# Patient Record
Sex: Female | Born: 1975 | Race: White | Hispanic: No | Smoking: Current every day smoker
Health system: Southern US, Community
[De-identification: ages and names within clinical notes are randomized; demographics above are authoritative.]

## PROBLEM LIST (undated history)

## (undated) DIAGNOSIS — J449 Chronic obstructive pulmonary disease, unspecified: Secondary | ICD-10-CM

## (undated) DIAGNOSIS — F32A Depression, unspecified: Secondary | ICD-10-CM

## (undated) DIAGNOSIS — F319 Bipolar disorder, unspecified: Secondary | ICD-10-CM

## (undated) HISTORY — PX: APPENDECTOMY: SHX54

## (undated) HISTORY — PX: CHOLECYSTECTOMY: SHX55

---

## 2020-11-19 ENCOUNTER — Emergency Department (HOSPITAL_COMMUNITY): Payer: Medicare (Managed Care)

## 2020-11-19 ENCOUNTER — Emergency Department (HOSPITAL_COMMUNITY)
Admission: EM | Admit: 2020-11-19 | Discharge: 2020-11-19 | Disposition: A | Discharge status: Home / Self Care | Payer: Medicare (Managed Care) | Attending: Emergency Medicine | Admitting: Emergency Medicine

## 2020-11-19 ENCOUNTER — Encounter (HOSPITAL_COMMUNITY): Payer: Self-pay | Admitting: Emergency Medicine

## 2020-11-19 DIAGNOSIS — J449 Chronic obstructive pulmonary disease, unspecified: Secondary | ICD-10-CM | POA: Diagnosis not present

## 2020-11-19 DIAGNOSIS — Z91040 Latex allergy status: Secondary | ICD-10-CM | POA: Insufficient documentation

## 2020-11-19 DIAGNOSIS — R519 Headache, unspecified: Secondary | ICD-10-CM | POA: Insufficient documentation

## 2020-11-19 HISTORY — DX: Chronic obstructive pulmonary disease, unspecified: J44.9

## 2020-11-19 MED ORDER — KETOROLAC TROMETHAMINE 30 MG/ML IJ SOLN
30.0000 mg | Freq: Once | INTRAMUSCULAR | Status: DC
  Filled 2020-11-19: qty 1

## 2020-11-19 NOTE — ED Provider Notes (Signed)
[facility] EMERGENCY DEPARTMENT Provider Note   CSN: [id number] Arrival date & time: 11/19/20  1356     History Chief Complaint  Patient presents with  . Headache    Abigail Cooper is a 45 y.o. female with history significant for COPD, methamphetamine use who presents for evaluation of headache.  Patient states she has had gradual onset of headache over the last week.  Denies any recent trauma or injuries.  No sudden onset thunderclap headache.  Tried taking Excedrin once which has not helped with her headache.  States she is recently homeless, living in her car and is under a lot of stress due to a recent break-up with a significant other.  Her headache feels like a banding across her forehead.  No lightheadedness, dizziness, vision changes, paresthesias, weakness, neck pain, neck stiffness.  Denies fever, vomiting.  She has been nauseous.  She denies any exposures to COVID.  No chest pain, shortness of breath, cough, abdominal pain, diarrhea, dysuria.  Denies additional aggravating or alleviating factors.  Patient does state that she currently does not have access to her home oxygen which she uses intermittently for her COPD.  Patient states that she is in the process of trying to obtain this from her living situation.  States she should be able to get this likely tomorrow.  She denies any wheeze, chest pain, shortness of breath, dyspnea on exertion.  Rates her pain a 6/10.  Patient requesting food, stating she has not eaten since yesterday.  History obtained from patient and past medical records.  No interpreter used  HPI     Past Medical History:  Diagnosis Date  . COPD (chronic obstructive pulmonary disease) (HCC)     There are no problems to display for this patient.  History reviewed  OB History   No obstetric history on file.     No family history on file.     Home Medications Prior to Admission medications   Not on File    Allergies    Latex  and Sulfa antibiotics  Review of Systems   Review of Systems  Constitutional: Negative.   HENT: Negative.   Respiratory: Negative.   Cardiovascular: Negative.   Gastrointestinal: Negative.   Genitourinary: Negative.   Musculoskeletal: Negative.   Skin: Negative.   Neurological: Positive for headaches. Negative for dizziness, tremors, seizures, syncope, facial asymmetry, speech difficulty, weakness, light-headedness and numbness.  All other systems reviewed and are negative.  Physical Exam Updated Vital Signs BP 116/84 (BP Location: Right Arm)   Pulse (!) 59   Temp 98.4 F (36.9 C) (Oral)   Resp 14   SpO2 100%     Physical Exam  Constitutional: Pt is oriented to person, place, and time. Pt appears well-developed and well-nourished. No distress.  HENT:  Head: Normocephalic and atraumatic.  Mouth/Throat: Oropharynx is clear and moist.  Eyes: Conjunctivae and EOM are normal. Pupils are equal, round, and reactive to light. No scleral icterus.  No horizontal, vertical or rotational nystagmus  Neck: Normal range of motion. Neck supple.  Full active and passive ROM without pain No midline or paraspinal tenderness No nuchal rigidity or meningeal signs  Cardiovascular: Normal rate, regular rhythm and intact distal pulses.   Pulmonary/Chest: Effort normal and breath sounds normal. No respiratory distress. Pt has no wheezes. No rales.  Abdominal: Soft. Bowel sounds are normal. There is no tenderness. There is no rebound and no guarding.  Musculoskeletal: Normal range of motion.  Lymphadenopathy:  No cervical adenopathy.  Neurological: Pt. is alert and oriented to person, place, and time. He has normal reflexes. No cranial nerve deficit.  Exhibits normal muscle tone. Coordination normal.  Mental Status:  Alert, oriented, thought content appropriate. Speech fluent without evidence of aphasia. Able to follow 2 step commands without difficulty.  Cranial Nerves:  II:  Peripheral visual  fields grossly normal, pupils equal, round, reactive to light, Disconjugate gaze, at baseline per patient III,IV, VI: ptosis not present V,VII: smile symmetric, facial light touch sensation equal VIII: hearing grossly normal bilaterally  IX,X: midline uvula rise  XI: bilateral shoulder shrug equal and strong XII: midline tongue extension  Motor:  5/5 in upper and lower extremities bilaterally including strong and equal grip strength and dorsiflexion/plantar flexion Sensory: Pinprick and light touch normal in all extremities.  Deep Tendon Reflexes: 2+ and symmetric  Cerebellar: normal finger-to-nose with bilateral upper extremities Gait: normal gait and balance CV: distal pulses palpable throughout   Skin: Skin is warm and dry. No rash noted. Pt is not diaphoretic.  Psychiatric: Pt has a normal mood and affect. Behavior is normal. Judgment and thought content normal.  Nursing note and vitals reviewed.  .ED Results / Procedures / Treatments   Labs (all labs ordered are listed, but only abnormal results are displayed) Labs Reviewed - No data to display  EKG None  Radiology CT Head Wo Contrast  Result Date: 11/19/2020 CLINICAL DATA:  Persistent headache new onset EXAM: CT HEAD WITHOUT CONTRAST TECHNIQUE: Contiguous axial images were obtained from the base of the skull through the vertex without intravenous contrast. COMPARISON:  None. FINDINGS: Brain: No evidence of acute infarction, hemorrhage, hydrocephalus, extra-axial collection or mass lesion/mass effect. 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 Vascular: No hyperdense vessel or unexpected calcification. Atherosclerotic calcifications of the internal carotid arteries. Skull: Normal. Negative for fracture or focal lesion. Sinuses/Orbits: No acute finding. Other: None. IMPRESSION: No acute intracranial abnormality. Electronically Signed   By: Maudry Mayhew MD   On: 11/19/2020 17:34    Procedures Procedures (including critical care  time)  Medications Ordered in ED Medications  ketorolac (TORADOL) 30 MG/ML injection 30 mg (has no administration in time range)    ED Course  I have reviewed the triage vital signs and the nursing notes.  Pertinent labs & imaging results that were available during my care of the patient were reviewed by me and considered in my medical decision making (see chart for details).  45 year old presents for evaluation of headache.  Afebrile, nonseptic, not ill-appearing.  Has a nonfocal neuro exam without deficits.  No recent trauma, sudden onset thunderclap headache.  CT without any significant findings.  Is ambulatory in ED without any difficulty.  She is on Suboxone for prior substance use.  Patient has been sleeping here in a hallway bed in the emergency department without any difficulty.  She did mention previously that she is intermittently on oxygen for COPD.  States she will be able to get this later this evening when she is discharged from her significant other's house.  She has no hypoxia here.  She is 100% on room air.  She has no tachycardia, tachypnea or hypoxia.  No respiratory distress, clear lungs.   Patient reassessed.  Pending medications for headache.  States she would like to go home after she gets this medication.  She will return there for headache worsens.  Given she has a stable neurologic exam I feel this is reasonable.  Presentation is like pts typical HA  and non concerning for dissection, SAH, ICH, Meningitis, bacterial infection, sepsis or temporal arteritis. Pt is afebrile with no focal neuro deficits, nuchal rigidity, or change in vision.   The patient has been appropriately medically screened and/or stabilized in the ED. I have low suspicion for any other emergent medical condition which would require further screening, evaluation or treatment in the ED or require inpatient management.  Patient is hemodynamically stable and in no acute distress.  Patient able to ambulate  in department prior to ED.  Evaluation does not show acute pathology that would require ongoing or additional emergent interventions while in the emergency department or further inpatient treatment.  I have discussed the diagnosis with the patient and answered all questions.  Pain is been managed while in the emergency department and patient has no further complaints prior to discharge.  Patient is comfortable with plan discussed in room and is stable for discharge at this time.  I have discussed strict return precautions for returning to the emergency department.  Patient was encouraged to follow-up with PCP/specialist refer to at discharge.    MDM Rules/Calculators/A&P                          Final Clinical Impression(s) / ED Diagnoses Final diagnoses:  Acute nonintractable headache, unspecified headache type    Rx / DC Orders ED Discharge Orders    None       Jahsiah Carpenter A, PA-C 11/19/20 1906    Arby Barrette, MD 11/20/20 0004

## 2020-11-19 NOTE — Discharge Instructions (Addendum)
Given medication here for your headache.  Your CT scan did not show any significant findings.  Return for new or worsening symptoms.

## 2020-11-19 NOTE — ED Notes (Signed)
Pt refused the ordered Toradol & left after her D/C paperwork & V/S were given & obtained.

## 2020-11-19 NOTE — ED Triage Notes (Addendum)
Patient complains of recent persistent headaches. Patient states she recently left a long term relationship, is currently homeless, and does not have access to her home oxygen anymore. Baseline supplemental oxygen 2L [place], room air saturation at rest 100%. Patient also reports recent methamphetamine use.

## 2020-12-16 ENCOUNTER — Emergency Department (HOSPITAL_COMMUNITY): Payer: 59

## 2020-12-16 ENCOUNTER — Emergency Department (HOSPITAL_COMMUNITY)
Admission: EM | Admit: 2020-12-16 | Discharge: 2020-12-17 | Disposition: A | Discharge status: Home / Self Care | Payer: 59 | Attending: Emergency Medicine | Admitting: Emergency Medicine

## 2020-12-16 ENCOUNTER — Encounter (HOSPITAL_COMMUNITY): Payer: Self-pay | Admitting: Emergency Medicine

## 2020-12-16 ENCOUNTER — Other Ambulatory Visit: Payer: Self-pay

## 2020-12-16 DIAGNOSIS — R062 Wheezing: Secondary | ICD-10-CM | POA: Insufficient documentation

## 2020-12-16 DIAGNOSIS — J449 Chronic obstructive pulmonary disease, unspecified: Secondary | ICD-10-CM | POA: Insufficient documentation

## 2020-12-16 DIAGNOSIS — F17200 Nicotine dependence, unspecified, uncomplicated: Secondary | ICD-10-CM | POA: Insufficient documentation

## 2020-12-16 DIAGNOSIS — R0781 Pleurodynia: Secondary | ICD-10-CM | POA: Diagnosis not present

## 2020-12-16 DIAGNOSIS — R059 Cough, unspecified: Secondary | ICD-10-CM | POA: Insufficient documentation

## 2020-12-16 DIAGNOSIS — Z20822 Contact with and (suspected) exposure to covid-19: Secondary | ICD-10-CM | POA: Diagnosis not present

## 2020-12-16 DIAGNOSIS — R002 Palpitations: Secondary | ICD-10-CM | POA: Insufficient documentation

## 2020-12-16 DIAGNOSIS — Z91040 Latex allergy status: Secondary | ICD-10-CM | POA: Insufficient documentation

## 2020-12-16 DIAGNOSIS — R079 Chest pain, unspecified: Secondary | ICD-10-CM | POA: Diagnosis present

## 2020-12-16 DIAGNOSIS — I10 Essential (primary) hypertension: Secondary | ICD-10-CM

## 2020-12-16 DIAGNOSIS — R001 Bradycardia, unspecified: Secondary | ICD-10-CM | POA: Diagnosis not present

## 2020-12-16 HISTORY — DX: Bipolar disorder, unspecified: F31.9

## 2020-12-16 HISTORY — DX: Depression, unspecified: F32.A

## 2020-12-16 LAB — BASIC METABOLIC PANEL
Anion gap: 4 — ABNORMAL LOW (ref 5–15)
BUN: 12 mg/dL (ref 6–20)
CO2: 27 mmol/L (ref 22–32)
Calcium: 9 mg/dL (ref 8.9–10.3)
Chloride: 106 mmol/L (ref 98–111)
Creatinine, Ser: 0.74 mg/dL (ref 0.44–1.00)
GFR, Estimated: >60 mL/min (ref >60)
Glucose, Bld: 104 mg/dL — ABNORMAL HIGH (ref 70–99)
Potassium: 3.8 mmol/L (ref 3.5–5.1)
Sodium: 137 mmol/L (ref 135–145)

## 2020-12-16 LAB — POC SARS CORONAVIRUS 2 AG -  ED: SARS Coronavirus 2 Ag: NEGATIVE

## 2020-12-16 LAB — TROPONIN I (HIGH SENSITIVITY)
Troponin I (High Sensitivity): 11 ng/L (ref <18)
Troponin I (High Sensitivity): 14 ng/L (ref <18)

## 2020-12-16 LAB — CBC
HCT: 47.3 % — ABNORMAL HIGH (ref 36.0–46.0)
Hemoglobin: 14.7 g/dL (ref 12.0–15.0)
MCH: 30.1 pg (ref 26.0–34.0)
MCHC: 31.1 g/dL (ref 30.0–36.0)
MCV: 96.7 fL (ref 80.0–100.0)
Platelets: 205 10*3/uL (ref 150–400)
RBC: 4.89 MIL/uL (ref 3.87–5.11)
RDW: 14.5 % (ref 11.5–15.5)
WBC: 6.2 10*3/uL (ref 4.0–10.5)
nRBC: 0 % (ref 0.0–0.2)

## 2020-12-16 LAB — POC URINE PREG, ED: Preg Test, Ur: NEGATIVE

## 2020-12-16 NOTE — ED Triage Notes (Signed)
Pt c/o left sided cp that started around 11 am today. Pt also states she felt like she was having palpitations also around 11. Pt c/o nausea and headache.

## 2020-12-16 NOTE — ED Provider Notes (Signed)
[facility] EMERGENCY DEPARTMENT Provider Note   CSN: [id number] Arrival date & time: 12/16/20  1818     History Chief Complaint  Patient presents with  . Chest Pain    Abigail Cooper is a 45 y.o. female.  HPI    Patient with history of COPD comes in w/ chief complaint of chest pain. Patient history of COPD, bipolar disorder. Patient having left-sided chest pain around 11 AM.  There was associated palpitations.  Pain is left-sided.  Pain is nonradiating.  There is no specific evoking, aggravating or relieving factors.  Patient has history of advanced COPD and is on oxygen at home.  Review of system is positive for palpitations without dizziness or near fainting.  No history of blood clots in the legs or lungs.  Positive cough.  No COVID-19 exposures that patient is aware of.  Past Medical History:  Diagnosis Date  . Bipolar 1 disorder (HCC)   . COPD (chronic obstructive pulmonary disease) (HCC)   . Depression     There are no problems to display for this patient.     OB History   No obstetric history on file.     No family history on file.  Social History   Tobacco Use  . Smoking status: Current Every Day Smoker    Packs/day: 0.50  . Smokeless tobacco: Never Used  Substance Use Topics  . Alcohol use: Yes    Comment: occasionally  . Drug use: Not Currently    Home Medications Prior to Admission medications   Medication Sig Start Date End Date Taking? Authorizing Provider  furosemide (LASIX) 20 MG tablet Take 1 tablet (20 mg total) by mouth daily for 5 days. 12/17/20 12/22/20 Yes Mesner, Barbara Cower, MD    Allergies    Latex and Sulfa antibiotics  Review of Systems   Review of Systems  Constitutional: Positive for activity change.  Respiratory: Positive for cough.   Cardiovascular: Positive for chest pain.  Allergic/Immunologic: Negative for immunocompromised state.  All other systems reviewed and are negative.   Physical Exam Updated Vital Signs BP  133/80   Pulse (!) 38   Temp 98.4 F (36.9 C)   Resp 10   Ht 5\' 4"  (1.626 m)   Wt 52.6 kg   SpO2 96%   BMI 19.91 kg/m   Physical Exam Vitals and nursing note reviewed.  Constitutional:      Appearance: She is well-developed.  HENT:     Head: Normocephalic and atraumatic.  Eyes:     Extraocular Movements: EOM normal.  Cardiovascular:     Rate and Rhythm: Normal rate.  Pulmonary:     Effort: Pulmonary effort is normal.     Breath sounds: Wheezing present. No rhonchi or rales.  Abdominal:     General: Bowel sounds are normal.  Musculoskeletal:     Cervical back: Normal range of motion and neck supple.  Skin:    General: Skin is warm and dry.  Neurological:     Mental Status: She is alert and oriented to person, place, and time.     ED Results / Procedures / Treatments   Labs (all labs ordered are listed, but only abnormal results are displayed) Labs Reviewed  BASIC METABOLIC PANEL - Abnormal; Notable for the following components:      Result Value   Glucose, Bld 104 (*)    Anion gap 4 (*)    All other components within normal limits  CBC - Abnormal; Notable for the following components:  HCT 47.3 (*)    All other components within normal limits  BRAIN NATRIURETIC PEPTIDE - Abnormal; Notable for the following components:   B Natriuretic Peptide 372.0 (*)    All other components within normal limits  D-DIMER, QUANTITATIVE (NOT AT [facility])  POC URINE PREG, ED  POC SARS CORONAVIRUS 2 AG -  ED  TROPONIN I (HIGH SENSITIVITY)  TROPONIN I (HIGH SENSITIVITY)    EKG EKG Interpretation  Date/Time:  Wednesday December 16 2020 18:43:07 EST Ventricular Rate:  66 PR Interval:  130 QRS Duration: 78 QT Interval:  374 QTC Calculation: 392 R Axis:   -179 Text Interpretation: Normal sinus rhythm Right superior axis deviation Pulmonary disease pattern Abnormal ECG No acute changes No significant change since last tracing Confirmed by Derwood Kaplan [phone]) on 12/16/2020  10:29:27 PM   Radiology No results found.  Procedures Procedures   Medications Ordered in ED Medications  acetaminophen (TYLENOL) tablet 1,000 mg (1,000 mg Oral Given 12/17/20 0138)  furosemide (LASIX) injection 20 mg (20 mg Intravenous Given 12/17/20 0139)    ED Course  I have reviewed the triage vital signs and the nursing notes.  Pertinent labs & imaging results that were available during my care of the patient were reviewed by me and considered in my medical decision making (see chart for details).    MDM Rules/Calculators/A&P                          Abigail Cooper was evaluated in Emergency Department on 12/18/2020 for the symptoms described in the history of present illness. She was evaluated in the context of the global COVID-19 pandemic, which necessitated consideration that the patient might be at risk for infection with the SARS-CoV-2 virus that causes COVID-19. Institutional protocols and algorithms that pertain to the evaluation of patients at risk for COVID-19 are in a state of rapid change based on information released by regulatory bodies including the CDC and federal and state organizations. These policies and algorithms were followed during the patient's care in the ED.  45 year old female with history of COPD on oxygen comes in with chief complaint of chest pain and shortness of breath.  Her chest pain is left-sided, there is pleuritic component to the pain.  There is no history of PE, DVT.  Differential diagnosis includes COVID-19, PE, pneumonia, COPD exacerbation.  Plan is to get chest x-ray, basic labs including D-dimer and reassess.  Reassessment: Signing out patient's care to incoming team.  D-dimer, BNP pending at this time. She has bradycardia, which we are not sure if is new or not.  Troponin x 1 is reassuring.  R/o PE If PE ruled out, treat for copd exacerbation and give patient outpatient cards f/u for bradycardia. Also, bnp is pending.  Final Clinical  Impression(s) / ED Diagnoses Final diagnoses:  Pleuritic chest pain  Bradycardia  Hypertension, unspecified type    Rx / DC Orders ED Discharge Orders         Ordered    furosemide (LASIX) 20 MG tablet  Daily        12/17/20 0356    Ambulatory referral to Cardiology        12/17/20 0356           Derwood Kaplan, MD 12/18/20 2009

## 2020-12-17 DIAGNOSIS — R0781 Pleurodynia: Secondary | ICD-10-CM | POA: Diagnosis not present

## 2020-12-17 LAB — D-DIMER, QUANTITATIVE: D-Dimer, Quant: 0.35 ug/mL-FEU (ref 0.00–0.50)

## 2020-12-17 LAB — BRAIN NATRIURETIC PEPTIDE: B Natriuretic Peptide: 372 pg/mL — ABNORMAL HIGH (ref 0.0–100.0)

## 2020-12-17 MED ORDER — ACETAMINOPHEN 500 MG PO TABS
1000.0000 mg | ORAL_TABLET | Freq: Once | ORAL | Status: AC
  Administered 2020-12-17: 1000 mg via ORAL
  Filled 2020-12-17: qty 2

## 2020-12-17 MED ORDER — FUROSEMIDE 10 MG/ML IJ SOLN
20.0000 mg | Freq: Once | INTRAMUSCULAR | Status: AC
  Administered 2020-12-17: 20 mg via INTRAVENOUS
  Filled 2020-12-17: qty 2

## 2020-12-17 MED ORDER — FUROSEMIDE 20 MG PO TABS: 20.0000 mg | ORAL_TABLET | Freq: Every day | ORAL | 0 refills | Status: AC

## 2020-12-17 NOTE — ED Provider Notes (Signed)
4:15 AM Assumed care from Dr. Rhunette Croft, please see their note for full history, physical and decision making until this point. In brief this is a 45 y.o. year old female who presented to the ED tonight with Chest Pain     Chest pain. Unlikely ACS. Suspect possible PE, pending d dimer but can likely be discharged.   On exam, no wheezing, no hypoxia, no dyspnea. xr with some interstitial markings, bnp added on and elevated. Significant diuresis and some improvement with lasix, will continue short course on discharge with cards/pcp follow up.   Discharge instructions, including strict return precautions for new or worsening symptoms, given. Patient and/or family verbalized understanding and agreement with the plan as described.   Labs, studies and imaging reviewed by myself and considered in medical decision making if ordered. Imaging interpreted by radiology.  Labs Reviewed  BASIC METABOLIC PANEL - Abnormal; Notable for the following components:      Result Value   Glucose, Bld 104 (*)    Anion gap 4 (*)    All other components within normal limits  CBC - Abnormal; Notable for the following components:   HCT 47.3 (*)    All other components within normal limits  BRAIN NATRIURETIC PEPTIDE - Abnormal; Notable for the following components:   B Natriuretic Peptide 372.0 (*)    All other components within normal limits  D-DIMER, QUANTITATIVE (NOT AT [facility])  POC URINE PREG, ED  POC SARS CORONAVIRUS 2 AG -  ED  TROPONIN I (HIGH SENSITIVITY)  TROPONIN I (HIGH SENSITIVITY)    DG Chest 2 View  Final Result      No follow-ups on file.    Abigail Cooper, Barbara Cower, MD 12/17/20 [id number]

## 2021-07-29 IMAGING — CT CT HEAD W/O CM
4 series · 17 of 47 positions shown, 19 images · non-contrast
Comparison: None.

CLINICAL DATA: Persistent headache new onset

EXAM:
CT HEAD WITHOUT CONTRAST
TECHNIQUE: Contiguous axial images were obtained from the base of the skull
through the vertex without intravenous contrast.

[Series 3: head without · axial · non-contrast · 0.41mm/px · z∈[-138,-8]mm · 7 of 36 slices shown, 9 images]
[im 5/36  brain]
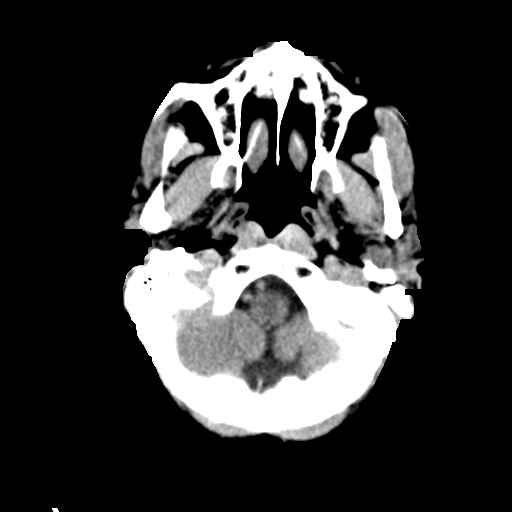
[im 5/36  bone]
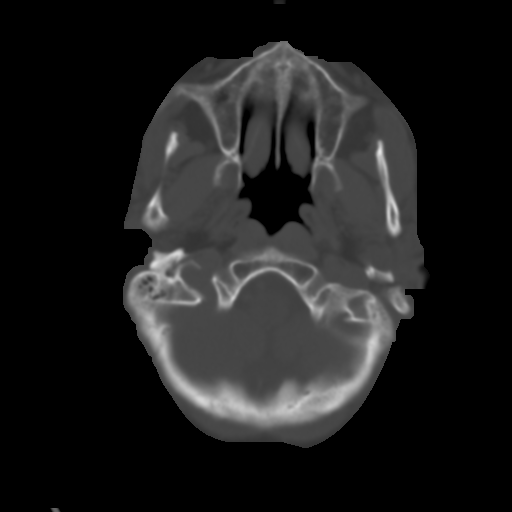
[im 9/36  brain]
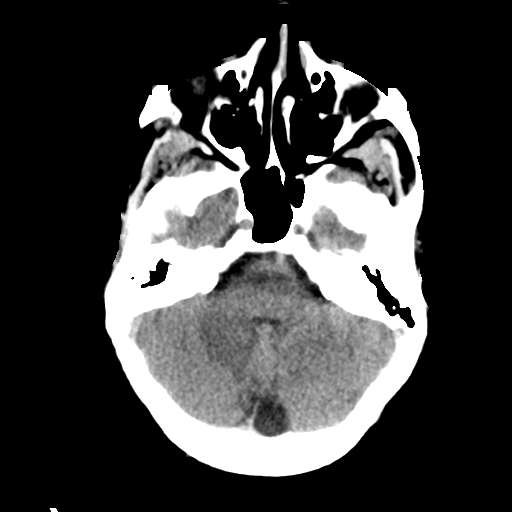
[im 14/36  brain]
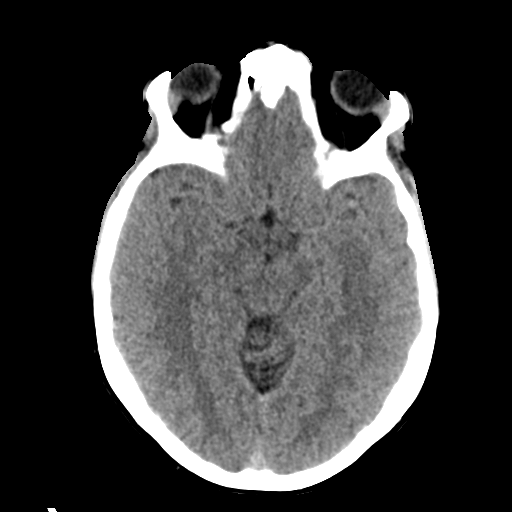
[im 18/36  brain]
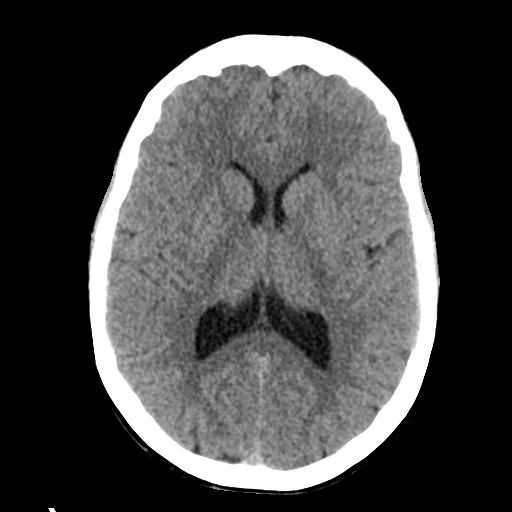
[im 22/36  brain]
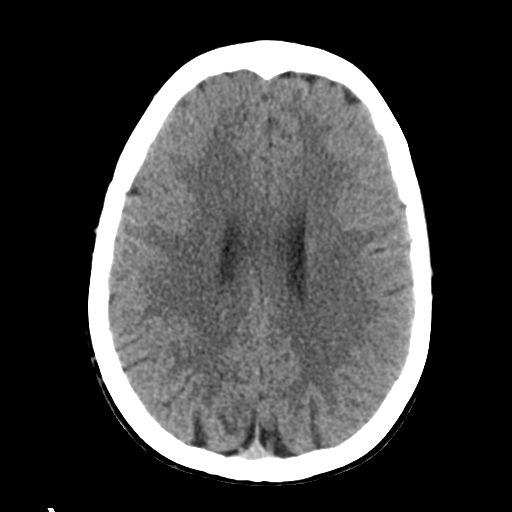
[im 22/36  bone]
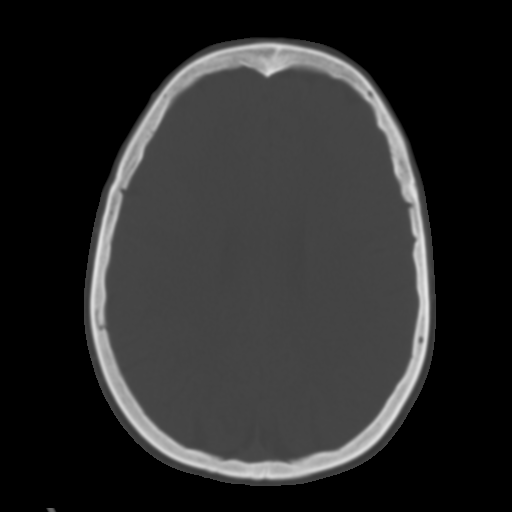
[im 27/36  brain]
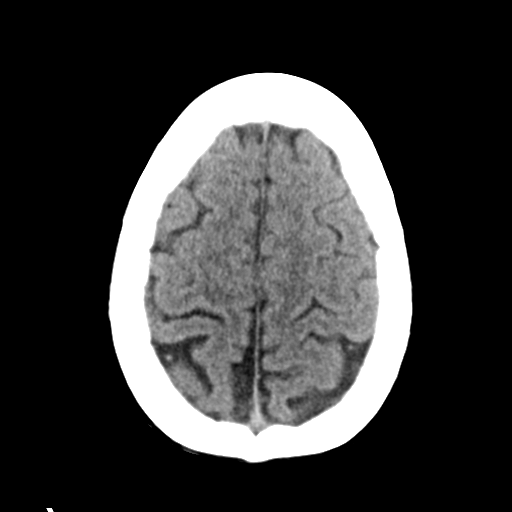
[im 31/36  brain]
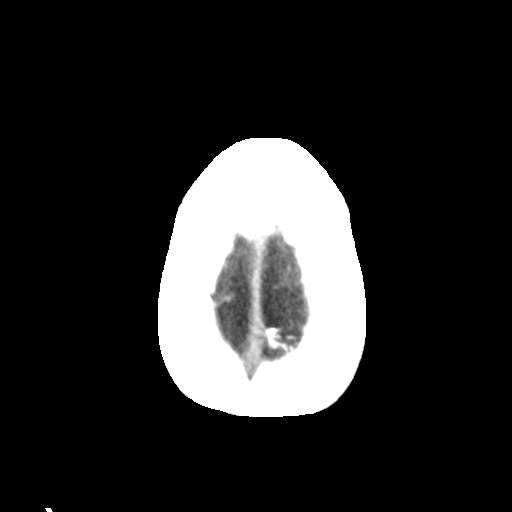

[Series 4: head bone · axial · 0.41mm/px · z∈[-142,-80]mm · 4 of 88 slices shown]
[im 9/88  bone]
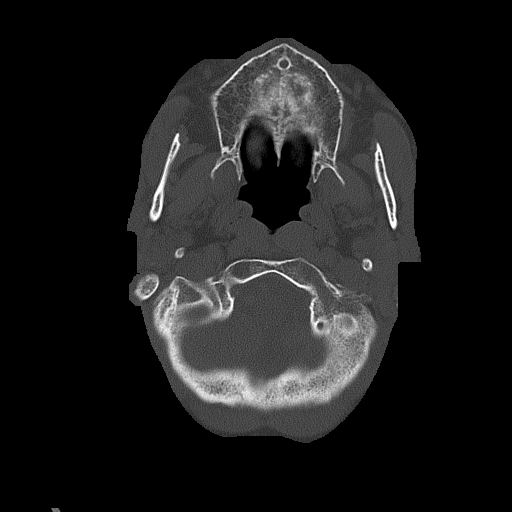
[im 18/88  bone]
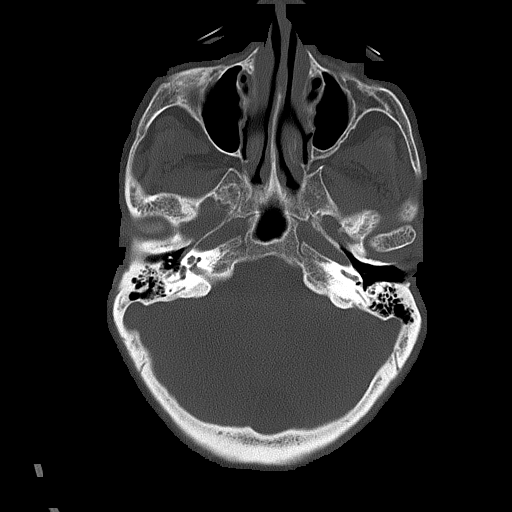
[im 27/88  bone]
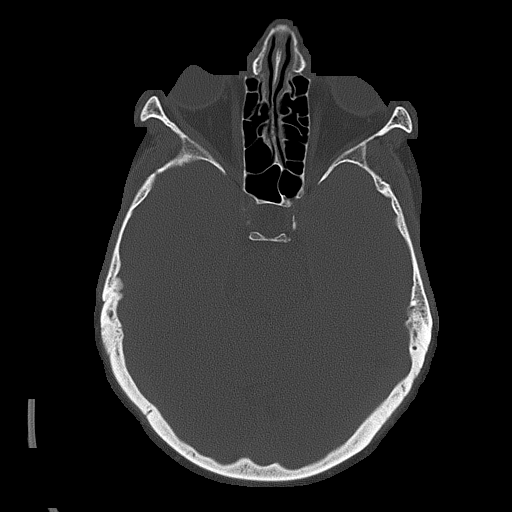
[im 40/88  bone]
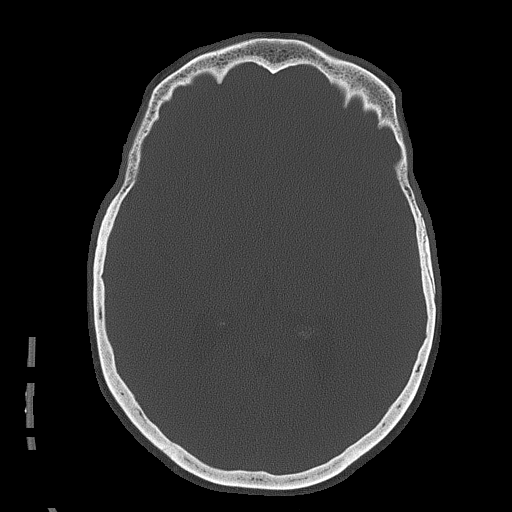

[Series 5: head without cor · coronal · non-contrast · 0.31mm/px · 3 of 68 slices shown]
[im 23/68  brain]
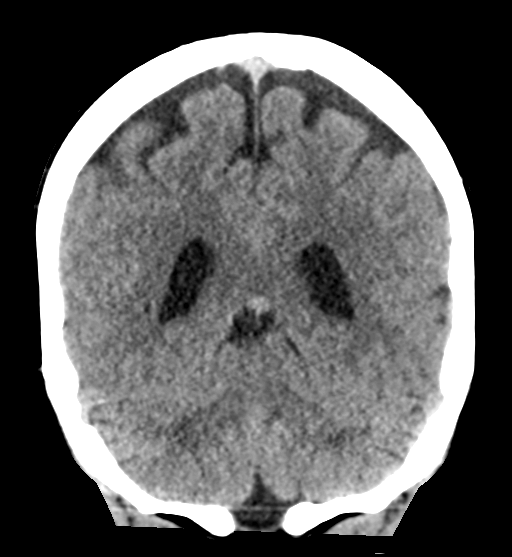
[im 30/68  brain]
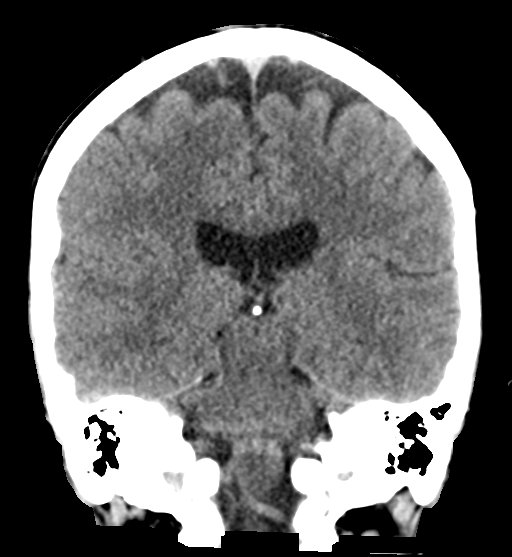
[im 38/68  brain]
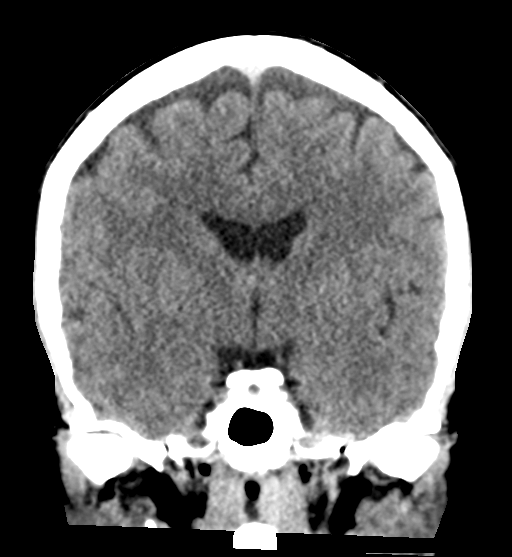

[Series 6: head without sag · sagittal · non-contrast · 0.34mm/px · 3 of 56 slices shown]
[im 19/56  brain]
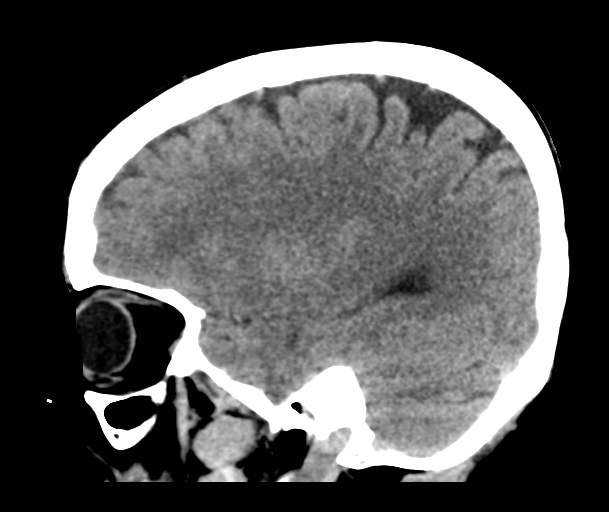
[im 28/56  brain]
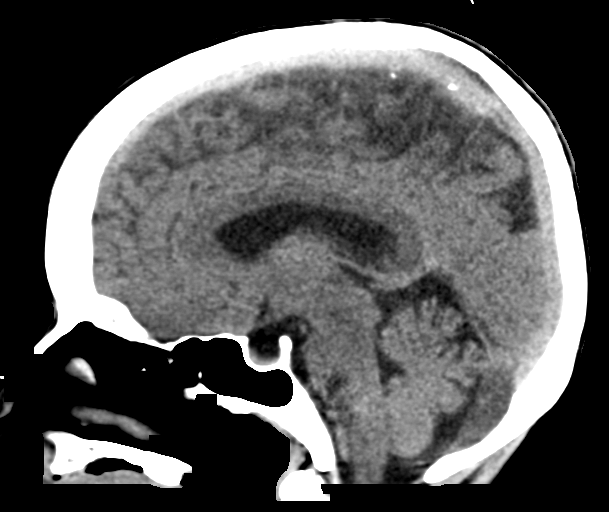
[im 37/56  brain]
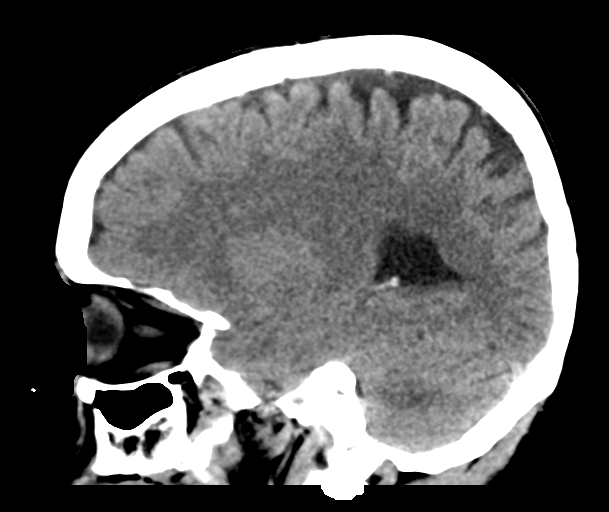

[17 of 47 positions shown; findings below may reference images not displayed]

FINDINGS: Brain: No evidence of acute infarction, hemorrhage, hydrocephalus,
extra-axial collection or mass lesion/mass effect. Arachnoid cyst in
posterior fossa.

Vascular: No hyperdense vessel or unexpected calcification.
Atherosclerotic calcifications of the internal carotid arteries.

Skull: Normal. Negative for fracture or focal lesion.

Sinuses/Orbits: No acute finding.

Other: None.
IMPRESSION: No acute intracranial abnormality.

## 2021-08-25 IMAGING — DX DG CHEST 2V
2 series · 2 of 2 positions shown · non-contrast
Comparison: None.

CLINICAL DATA: Left chest pain

EXAM:
CHEST - 2 VIEW

[chest pa]
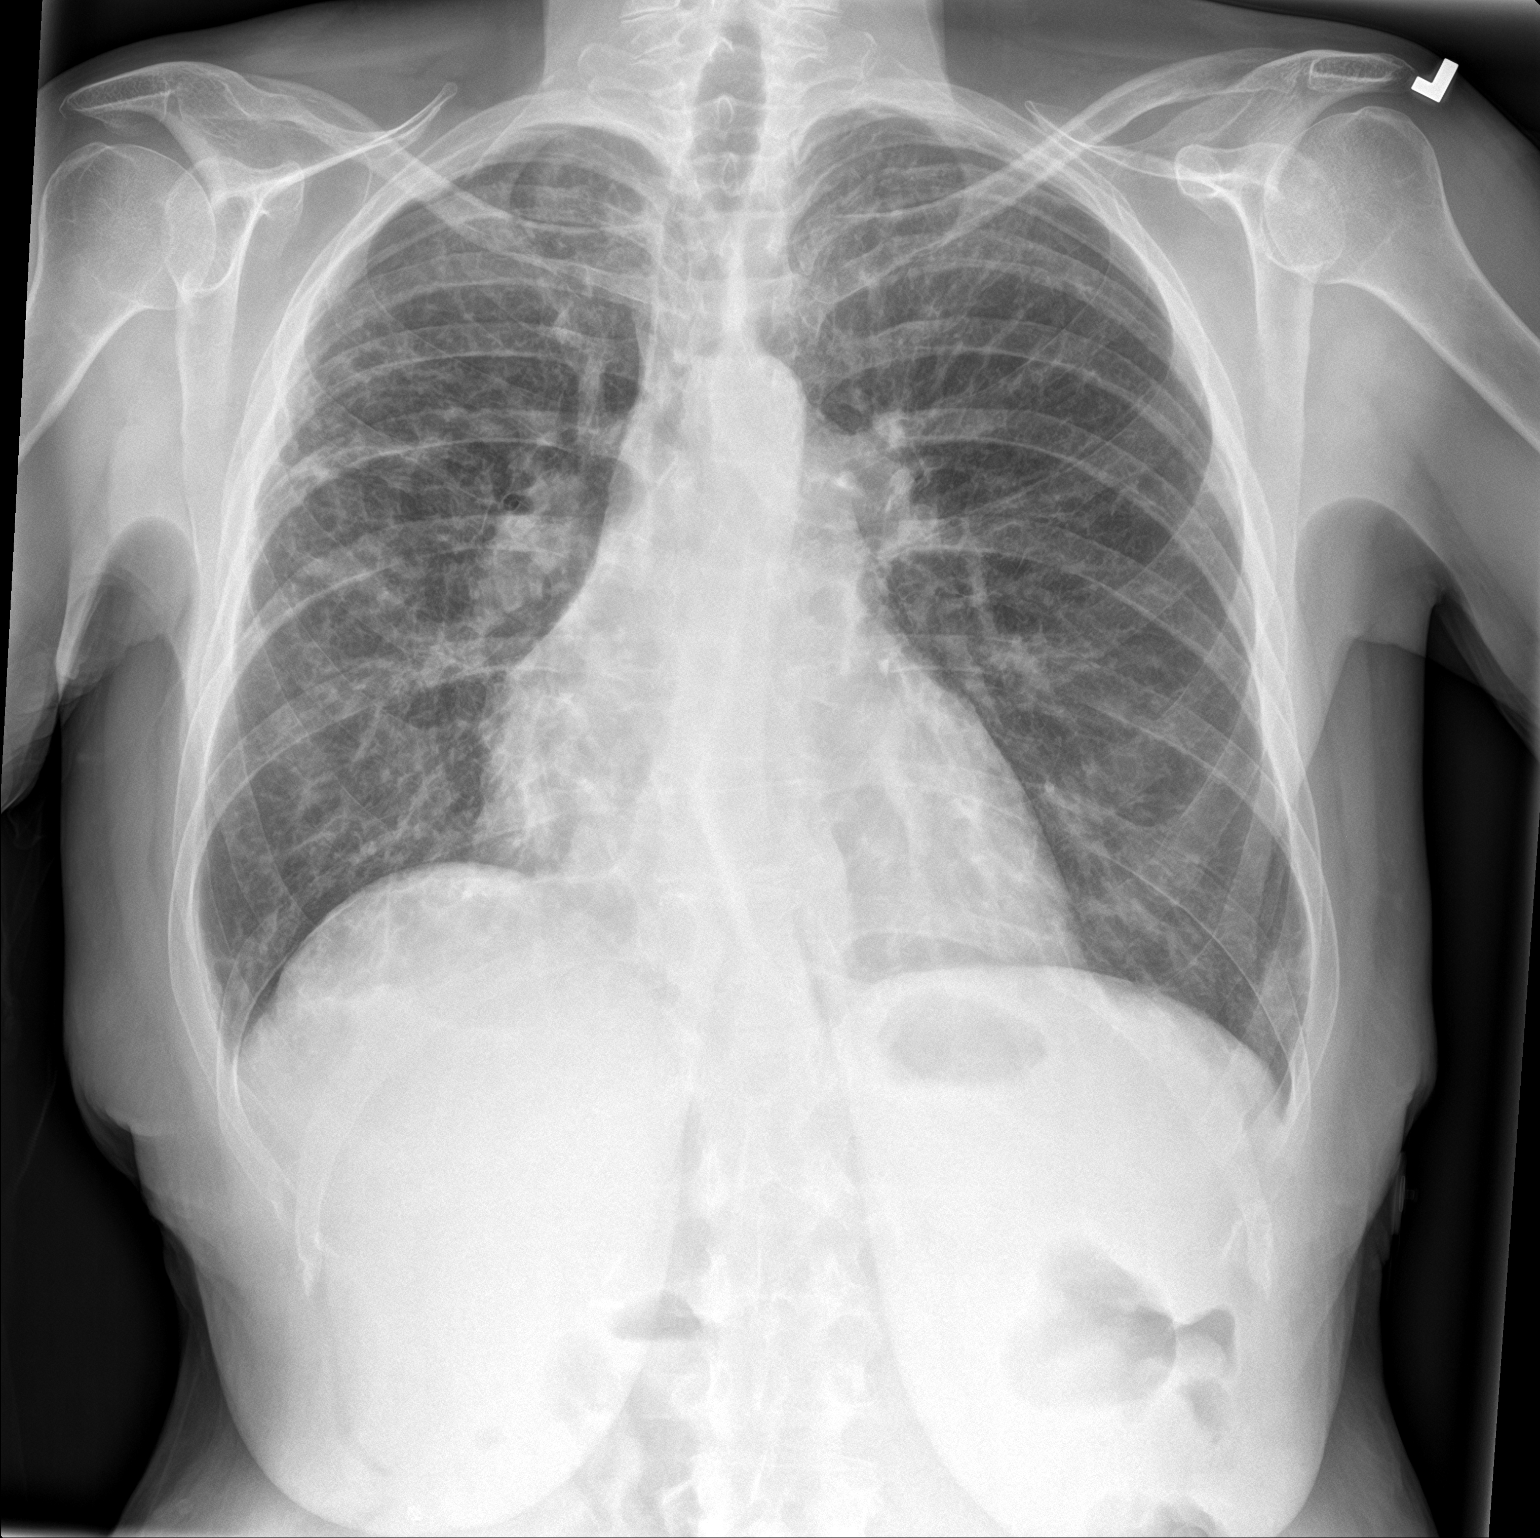

[chest lat]
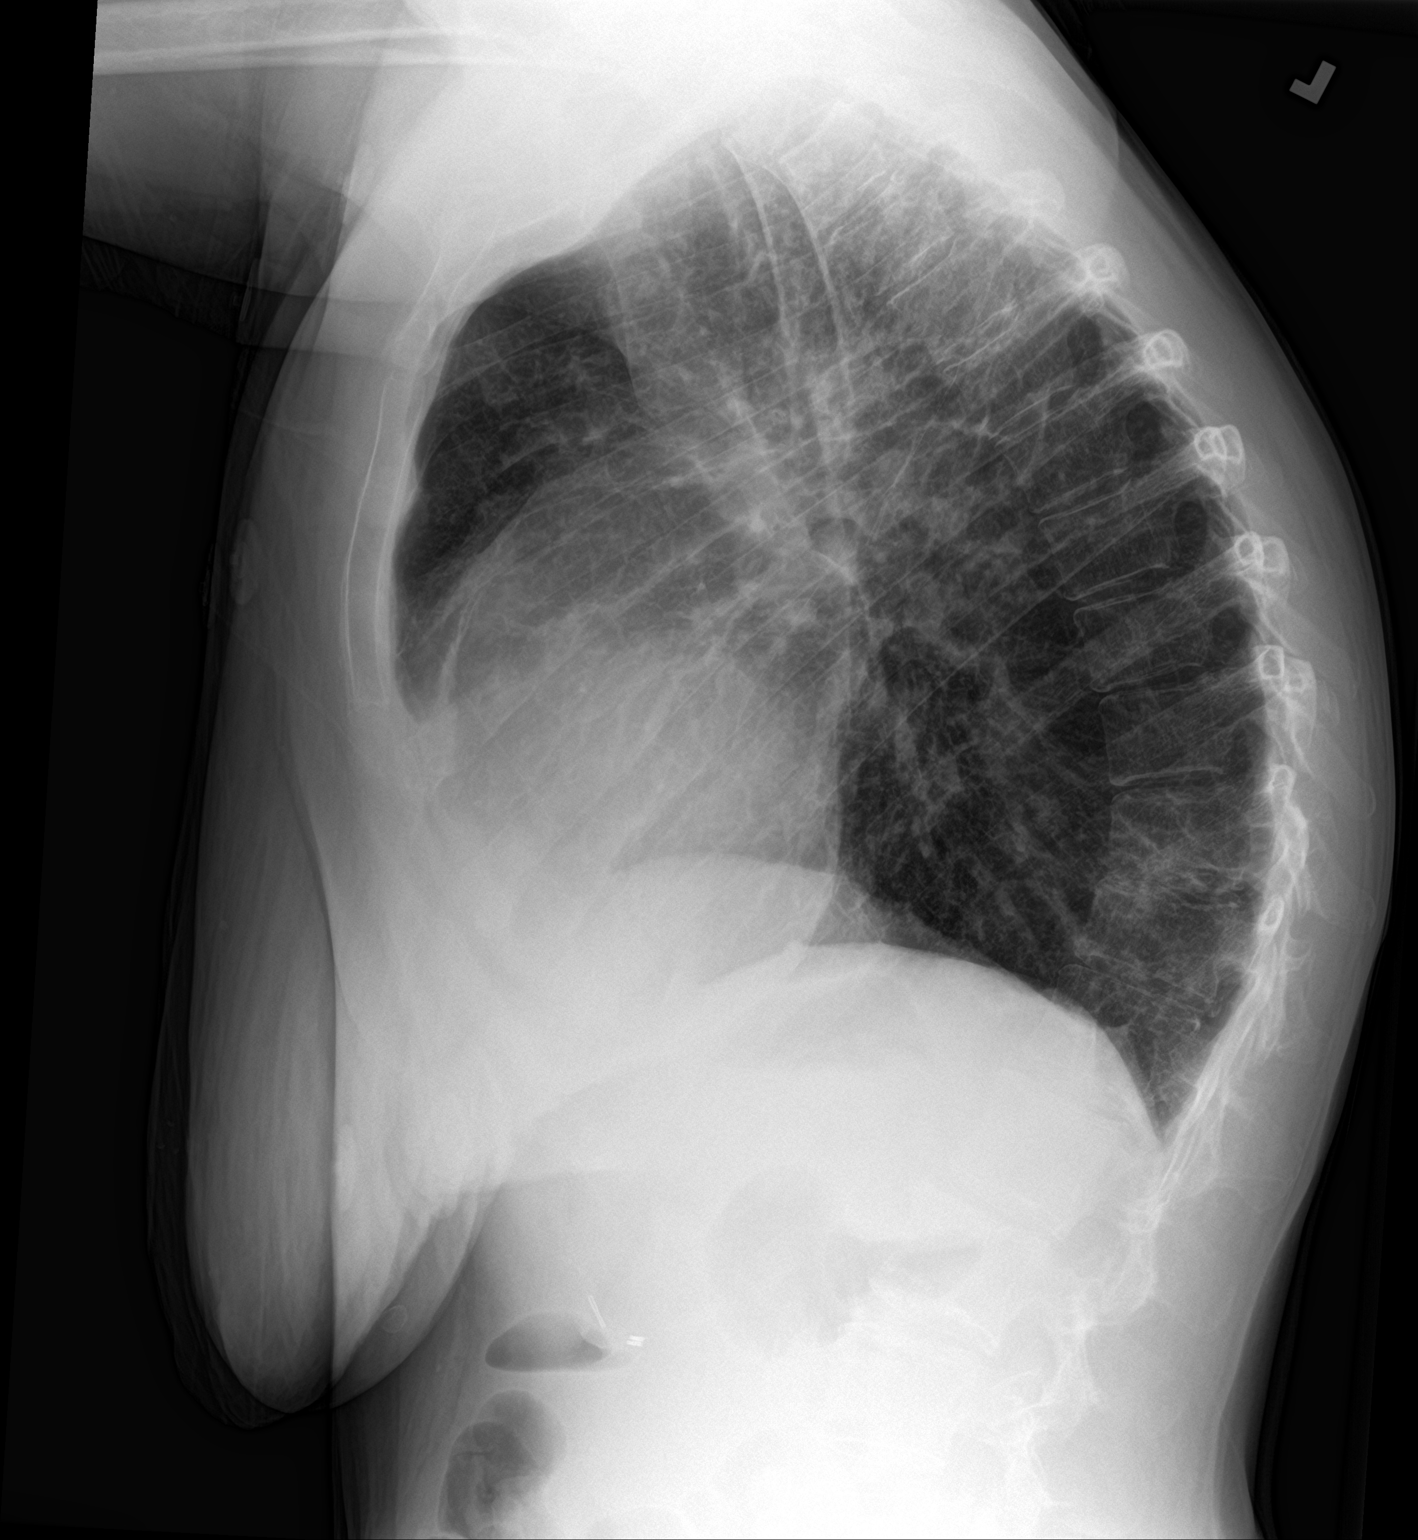

[2 of 2 positions shown; findings below may reference images not displayed]

FINDINGS: Diffusely coarsened interstitial opacities are present throughout
both lungs, right greater than left. Additional bronchitic features
are seen. Slightly more patchy opacity may be present in the right
mid lung. No pneumothorax. No visible effusion. The aorta is
calcified. The remaining cardiomediastinal contours are
unremarkable. No acute osseous or soft tissue abnormality.
IMPRESSION: Diffusely coarsened interstitial opacities throughout both lungs,
right greater than left with few scattered slightly more focal
opacities. While findings could reflect an acute
infectious/inflammatory process including atypical pneumonia,
underlying interstitial lung disease could have a similar
appearance.

## 2022-05-22 ENCOUNTER — Encounter (HOSPITAL_COMMUNITY): Payer: Self-pay

## 2022-05-22 ENCOUNTER — Other Ambulatory Visit: Payer: Self-pay

## 2022-05-22 ENCOUNTER — Emergency Department (HOSPITAL_COMMUNITY)
Admission: EM | Admit: 2022-05-22 | Discharge: 2022-05-22 | Disposition: A | Discharge status: Home / Self Care | Payer: Medicare Other | Attending: Emergency Medicine | Admitting: Emergency Medicine

## 2022-05-22 DIAGNOSIS — S31109A Unspecified open wound of abdominal wall, unspecified quadrant without penetration into peritoneal cavity, initial encounter: Secondary | ICD-10-CM | POA: Insufficient documentation

## 2022-05-22 DIAGNOSIS — R062 Wheezing: Secondary | ICD-10-CM | POA: Insufficient documentation

## 2022-05-22 DIAGNOSIS — Z91040 Latex allergy status: Secondary | ICD-10-CM | POA: Insufficient documentation

## 2022-05-22 DIAGNOSIS — L304 Erythema intertrigo: Secondary | ICD-10-CM

## 2022-05-22 DIAGNOSIS — L03311 Cellulitis of abdominal wall: Secondary | ICD-10-CM

## 2022-05-22 DIAGNOSIS — J449 Chronic obstructive pulmonary disease, unspecified: Secondary | ICD-10-CM | POA: Insufficient documentation

## 2022-05-22 DIAGNOSIS — X58XXXA Exposure to other specified factors, initial encounter: Secondary | ICD-10-CM | POA: Insufficient documentation

## 2022-05-22 LAB — CBC WITH DIFFERENTIAL/PLATELET
Abs Immature Granulocytes: 0.01 10*3/uL (ref 0.00–0.07)
Basophils Absolute: 0 10*3/uL (ref 0.0–0.1)
Basophils Relative: 1 %
Eosinophils Absolute: 0.2 10*3/uL (ref 0.0–0.5)
Eosinophils Relative: 4 %
HCT: 51.1 % — ABNORMAL HIGH (ref 36.0–46.0)
Hemoglobin: 15.6 g/dL — ABNORMAL HIGH (ref 12.0–15.0)
Immature Granulocytes: 0 %
Lymphocytes Relative: 33 %
Lymphs Abs: 1.3 10*3/uL (ref 0.7–4.0)
MCH: 28.4 pg (ref 26.0–34.0)
MCHC: 30.5 g/dL (ref 30.0–36.0)
MCV: 93.1 fL (ref 80.0–100.0)
Monocytes Absolute: 0.2 10*3/uL (ref 0.1–1.0)
Monocytes Relative: 6 %
Neutro Abs: 2.2 10*3/uL (ref 1.7–7.7)
Neutrophils Relative %: 56 %
Platelets: 229 10*3/uL (ref 150–400)
RBC: 5.49 MIL/uL — ABNORMAL HIGH (ref 3.87–5.11)
RDW: 17.3 % — ABNORMAL HIGH (ref 11.5–15.5)
WBC: 4 10*3/uL (ref 4.0–10.5)
nRBC: 0 % (ref 0.0–0.2)

## 2022-05-22 LAB — COMPREHENSIVE METABOLIC PANEL
ALT: 12 U/L (ref 0–44)
AST: 15 U/L (ref 15–41)
Albumin: 3.2 g/dL — ABNORMAL LOW (ref 3.5–5.0)
Alkaline Phosphatase: 144 U/L — ABNORMAL HIGH (ref 38–126)
Anion gap: 7 (ref 5–15)
BUN: 11 mg/dL (ref 6–20)
CO2: 29 mmol/L (ref 22–32)
Calcium: 9.4 mg/dL (ref 8.9–10.3)
Chloride: 105 mmol/L (ref 98–111)
Creatinine, Ser: 0.63 mg/dL (ref 0.44–1.00)
GFR, Estimated: >60 mL/min (ref >60)
Glucose, Bld: 92 mg/dL (ref 70–99)
Potassium: 4.4 mmol/L (ref 3.5–5.1)
Sodium: 141 mmol/L (ref 135–145)
Total Bilirubin: 0.4 mg/dL (ref 0.3–1.2)
Total Protein: 6.3 g/dL — ABNORMAL LOW (ref 6.5–8.1)

## 2022-05-22 MED ORDER — CEPHALEXIN 500 MG PO CAPS: 500.0000 mg | ORAL_CAPSULE | Freq: Four times a day (QID) | ORAL | 0 refills | Status: AC

## 2022-05-22 MED ORDER — CLOTRIMAZOLE 1 % EX CREA: TOPICAL_CREAM | CUTANEOUS | 0 refills | Status: AC

## 2022-05-22 NOTE — ED Triage Notes (Signed)
Pt reports a wound on her left abdomen. Pt states she wants it looked at. Wound x4 months. Pt reports suboxone shot in abdomen that she states "my body rejected and they had to clean it out."

## 2022-05-22 NOTE — Discharge Instructions (Addendum)
The Keflex should help with the potential skin infection and the other ointment is for more on the wounds by the breast.  Looks like there may be a yeast infection there.  Our transition his care team has been consulted and hopefully they can help you find a primary care doctor.  Return for worsening symptoms.  You will need further wound care for the wound.
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
## 2022-06-02 ENCOUNTER — Encounter (HOSPITAL_BASED_OUTPATIENT_CLINIC_OR_DEPARTMENT_OTHER): Payer: Medicare Other | Attending: Internal Medicine | Admitting: Internal Medicine

## 2023-03-08 DEATH — deceased
